# Patient Record
Sex: Male | Born: 1960 | Race: Black or African American | Hispanic: No | Marital: Single | Smoking: Never smoker
Health system: Southern US, Community
[De-identification: ages and names within clinical notes are randomized; demographics above are authoritative.]

## PROBLEM LIST (undated history)

## (undated) DIAGNOSIS — I10 Essential (primary) hypertension: Secondary | ICD-10-CM

## (undated) HISTORY — PX: WRIST SURGERY: SHX841

---

## 2007-05-26 ENCOUNTER — Emergency Department: Payer: Self-pay | Admitting: Emergency Medicine

## 2015-02-23 ENCOUNTER — Emergency Department: Admit: 2015-02-23 | Disposition: A | Discharge status: Home / Self Care | Payer: Self-pay | Admitting: Internal Medicine

## 2015-03-09 ENCOUNTER — Emergency Department
Admit: 2015-03-09 | Disposition: A | Discharge status: Home / Self Care | Payer: Self-pay | Admitting: Emergency Medicine

## 2015-11-11 ENCOUNTER — Emergency Department: Payer: Self-pay

## 2015-11-11 ENCOUNTER — Emergency Department
Admission: EM | Admit: 2015-11-11 | Discharge: 2015-11-11 | Disposition: A | Discharge status: Home / Self Care | Payer: Self-pay | Attending: Emergency Medicine | Admitting: Emergency Medicine

## 2015-11-11 ENCOUNTER — Encounter: Payer: Self-pay | Admitting: Emergency Medicine

## 2015-11-11 DIAGNOSIS — S99911A Unspecified injury of right ankle, initial encounter: Secondary | ICD-10-CM | POA: Insufficient documentation

## 2015-11-11 DIAGNOSIS — Y92410 Unspecified street and highway as the place of occurrence of the external cause: Secondary | ICD-10-CM | POA: Insufficient documentation

## 2015-11-11 DIAGNOSIS — Y9301 Activity, walking, marching and hiking: Secondary | ICD-10-CM | POA: Insufficient documentation

## 2015-11-11 DIAGNOSIS — S199XXA Unspecified injury of neck, initial encounter: Secondary | ICD-10-CM | POA: Insufficient documentation

## 2015-11-11 DIAGNOSIS — Y998 Other external cause status: Secondary | ICD-10-CM | POA: Insufficient documentation

## 2015-11-11 DIAGNOSIS — M791 Myalgia, unspecified site: Secondary | ICD-10-CM

## 2015-11-11 DIAGNOSIS — I10 Essential (primary) hypertension: Secondary | ICD-10-CM | POA: Insufficient documentation

## 2015-11-11 DIAGNOSIS — S3992XA Unspecified injury of lower back, initial encounter: Secondary | ICD-10-CM | POA: Insufficient documentation

## 2015-11-11 HISTORY — DX: Essential (primary) hypertension: I10

## 2015-11-11 MED ORDER — IBUPROFEN 800 MG PO TABS
800.0000 mg | ORAL_TABLET | Freq: Once | ORAL | Status: AC
  Administered 2015-11-11: 800 mg via ORAL
  Filled 2015-11-11: qty 1

## 2015-11-11 MED ORDER — IBUPROFEN 800 MG PO TABS: 800.0000 mg | ORAL_TABLET | Freq: Three times a day (TID) | ORAL | Status: DC | PRN

## 2015-11-11 MED ORDER — CYCLOBENZAPRINE HCL 10 MG PO TABS: 10.0000 mg | ORAL_TABLET | Freq: Three times a day (TID) | ORAL | Status: DC | PRN

## 2015-11-11 MED ORDER — CYCLOBENZAPRINE HCL 10 MG PO TABS
10.0000 mg | ORAL_TABLET | Freq: Once | ORAL | Status: AC
  Administered 2015-11-11: 10 mg via ORAL
  Filled 2015-11-11: qty 1

## 2015-11-11 NOTE — Discharge Instructions (Signed)
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
## 2015-11-11 NOTE — ED Notes (Signed)
Pt ambulatory at discharge.  
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
## 2015-11-11 NOTE — ED Notes (Signed)
Pt told to lay flat until xray is cleared. Pt attempts to get up and sit up while saying, "my back is sore".

## 2018-06-10 ENCOUNTER — Encounter: Payer: Self-pay | Admitting: Medical Oncology

## 2018-06-10 ENCOUNTER — Emergency Department
Admission: EM | Admit: 2018-06-10 | Discharge: 2018-06-10 | Disposition: A | Discharge status: Home / Self Care | Payer: Self-pay | Attending: Emergency Medicine | Admitting: Emergency Medicine

## 2018-06-10 ENCOUNTER — Emergency Department: Payer: Self-pay

## 2018-06-10 DIAGNOSIS — M7731 Calcaneal spur, right foot: Secondary | ICD-10-CM | POA: Insufficient documentation

## 2018-06-10 DIAGNOSIS — I10 Essential (primary) hypertension: Secondary | ICD-10-CM | POA: Insufficient documentation

## 2018-06-10 MED ORDER — NAPROXEN 500 MG PO TABS: 500.0000 mg | ORAL_TABLET | Freq: Two times a day (BID) | ORAL | Status: DC
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
## 2018-06-10 NOTE — ED Triage Notes (Signed)
Pt reports tripping Wednesday and since then has been having pain to rt heel. Pt ambulatory .

## 2018-06-10 NOTE — ED Notes (Signed)
See triage note stats he fell on weds  Having pain to heel   No swelling noted  Good pulses

## 2018-06-10 NOTE — Discharge Instructions (Addendum)
Follow discharge care instruction.  Advised to try over-the-counter heel spurs supports from the local pharmacy.

## 2019-05-07 ENCOUNTER — Emergency Department: Payer: Self-pay

## 2019-05-07 ENCOUNTER — Other Ambulatory Visit: Payer: Self-pay

## 2019-05-07 ENCOUNTER — Emergency Department
Admission: EM | Admit: 2019-05-07 | Discharge: 2019-05-07 | Disposition: A | Discharge status: Home / Self Care | Payer: Self-pay | Attending: Emergency Medicine | Admitting: Emergency Medicine

## 2019-05-07 ENCOUNTER — Encounter: Payer: Self-pay | Admitting: Emergency Medicine

## 2019-05-07 DIAGNOSIS — M519 Unspecified thoracic, thoracolumbar and lumbosacral intervertebral disc disorder: Secondary | ICD-10-CM

## 2019-05-07 DIAGNOSIS — M5136 Other intervertebral disc degeneration, lumbar region: Secondary | ICD-10-CM | POA: Insufficient documentation

## 2019-05-07 DIAGNOSIS — I10 Essential (primary) hypertension: Secondary | ICD-10-CM | POA: Insufficient documentation

## 2019-05-07 DIAGNOSIS — M549 Dorsalgia, unspecified: Secondary | ICD-10-CM | POA: Insufficient documentation

## 2019-05-07 DIAGNOSIS — W19XXXA Unspecified fall, initial encounter: Secondary | ICD-10-CM

## 2019-05-07 LAB — CBC WITH DIFFERENTIAL/PLATELET
Abs Immature Granulocytes: 0.02 10*3/uL (ref 0.00–0.07)
Basophils Absolute: 0.1 10*3/uL (ref 0.0–0.1)
Basophils Relative: 1 %
Eosinophils Absolute: 0.1 10*3/uL (ref 0.0–0.5)
Eosinophils Relative: 1 %
HCT: 48.5 % (ref 39.0–52.0)
Hemoglobin: 16.6 g/dL (ref 13.0–17.0)
Immature Granulocytes: 0 %
Lymphocytes Relative: 22 %
Lymphs Abs: 2.2 10*3/uL (ref 0.7–4.0)
MCH: 31.6 pg (ref 26.0–34.0)
MCHC: 34.2 g/dL (ref 30.0–36.0)
MCV: 92.4 fL (ref 80.0–100.0)
Monocytes Absolute: 1 10*3/uL (ref 0.1–1.0)
Monocytes Relative: 10 %
Neutro Abs: 6.6 10*3/uL (ref 1.7–7.7)
Neutrophils Relative %: 66 %
Platelets: 202 10*3/uL (ref 150–400)
RBC: 5.25 MIL/uL (ref 4.22–5.81)
RDW: 12.3 % (ref 11.5–15.5)
WBC: 10 10*3/uL (ref 4.0–10.5)
nRBC: 0 % (ref 0.0–0.2)

## 2019-05-07 LAB — COMPREHENSIVE METABOLIC PANEL
ALT: 80 U/L — ABNORMAL HIGH (ref 0–44)
AST: 83 U/L — ABNORMAL HIGH (ref 15–41)
Albumin: 3.9 g/dL (ref 3.5–5.0)
Alkaline Phosphatase: 53 U/L (ref 38–126)
Anion gap: 9 (ref 5–15)
BUN: 11 mg/dL (ref 6–20)
CO2: 21 mmol/L — ABNORMAL LOW (ref 22–32)
Calcium: 9.1 mg/dL (ref 8.9–10.3)
Chloride: 110 mmol/L (ref 98–111)
Creatinine, Ser: 1.09 mg/dL (ref 0.61–1.24)
GFR calc Af Amer: >60 mL/min (ref >60)
GFR calc non Af Amer: >60 mL/min (ref >60)
Glucose, Bld: 115 mg/dL — ABNORMAL HIGH (ref 70–99)
Potassium: 3.7 mmol/L (ref 3.5–5.1)
Sodium: 140 mmol/L (ref 135–145)
Total Bilirubin: 0.8 mg/dL (ref 0.3–1.2)
Total Protein: 7.7 g/dL (ref 6.5–8.1)

## 2019-05-07 LAB — TROPONIN I: Troponin I: <0.03 ng/mL (ref <0.03)

## 2019-05-07 LAB — ETHANOL: Alcohol, Ethyl (B): 22 mg/dL — ABNORMAL HIGH (ref <10)

## 2019-05-07 MED ORDER — PREDNISONE 10 MG (21) PO TBPK: ORAL_TABLET | ORAL | 0 refills | Status: AC

## 2019-05-07 MED ORDER — HYDROMORPHONE HCL 1 MG/ML IJ SOLN
1.0000 mg | Freq: Once | INTRAMUSCULAR | Status: AC
  Administered 2019-05-07: 1 mg via INTRAVENOUS
  Filled 2019-05-07: qty 1

## 2019-05-07 MED ORDER — OXYCODONE-ACETAMINOPHEN 5-325 MG PO TABS: 1.0000 | ORAL_TABLET | ORAL | 0 refills | Status: AC | PRN

## 2019-05-07 MED ORDER — MORPHINE SULFATE (PF) 4 MG/ML IV SOLN
6.0000 mg | Freq: Once | INTRAVENOUS | Status: AC
  Administered 2019-05-07: 6 mg via INTRAVENOUS
  Filled 2019-05-07: qty 2

## 2019-05-07 MED ORDER — SODIUM CHLORIDE 0.9 % IV BOLUS
1000.0000 mL | Freq: Once | INTRAVENOUS | Status: AC
  Administered 2019-05-07: 1000 mL via INTRAVENOUS

## 2019-05-07 MED ORDER — CYCLOBENZAPRINE HCL 10 MG PO TABS
5.0000 mg | ORAL_TABLET | Freq: Once | ORAL | Status: AC
  Administered 2019-05-07: 5 mg via ORAL
  Filled 2019-05-07: qty 1

## 2019-05-07 NOTE — ED Notes (Signed)
Pt able to get up and ambulated now. Is walking up and down the hallway at his own request (doesn't want to sit back down).

## 2019-05-07 NOTE — ED Notes (Signed)
Attempted to get pt up to ambulate - pt unable to pull himself up and states unable to walk. Pt requesting food, advised he can't eat until we figure out what's wrong. Pt states understanding.

## 2019-05-07 NOTE — ED Provider Notes (Signed)
[facility] Emergency Department Provider Note  ____________________________________________   First MD Initiated Contact with Patient 05/07/19 1412     (approximate)  I have reviewed the triage vital signs and the nursing notes.   HISTORY  Chief Complaint Fall (yesterday)    HPI Shawn Warner is a 58 y.o. male here with syncopal episode.  The patient states he was very intoxicated at the park yesterday.  He states that he believes he gets intoxicated he passed out.  He fell forward, striking his head.  He is unsure whether he lost consciousness prior to or after the fall.  He is unsure how long he was down.  He was able to get up and walk home, but states that since then, he has had progressive worsening aching, throbbing, midline thoracic back pain.  He said difficulty walking due to this pain.  Denies any distal numbness or weakness in lower or upper extremities.  He presents today due to persistent pain, worse with walking.  No alleviating factors.  No loss of bowel or bladder function.  No fevers or chills.  He states he often passes out when he drinks this much.  Denies any chest pain or shortness of breath.        Past Medical History:  Diagnosis Date  . Hypertension     There are no active problems to display for this patient.   Past Surgical History:  Procedure Laterality Date  . WRIST SURGERY      Prior to Admission medications   Not on File    Allergies Patient has no known allergies.  History reviewed. No pertinent family history.  Social History Social History   Tobacco Use  . Smoking status: Never Smoker  . Smokeless tobacco: Never Used  Substance Use Topics  . Alcohol use: Yes    Comment: weekends  . Drug use: No    Review of Systems  Review of Systems  Constitutional: Negative for chills, fatigue and fever.  HENT: Negative for congestion and rhinorrhea.   Eyes: Negative for visual disturbance.  Respiratory:  Negative for cough and shortness of breath.   Gastrointestinal: Negative for abdominal pain, diarrhea, nausea and vomiting.  Genitourinary: Negative for dysuria and flank pain.  Musculoskeletal: Positive for back pain and gait problem.  Skin: Negative for rash and wound.  Neurological: Positive for syncope. Negative for weakness and light-headedness.  All other systems reviewed and are negative.    ____________________________________________  PHYSICAL EXAM:      VITAL SIGNS: ED Triage Vitals  Enc Vitals Group     BP 05/07/19 1341 (!) 185/106     Pulse Rate 05/07/19 1341 94     Resp 05/07/19 1341 20     Temp 05/07/19 1341 98 F (36.7 C)     Temp Source 05/07/19 1341 Oral     SpO2 05/07/19 1341 98 %     Weight 05/07/19 1342 259 lb (117.5 kg)     Height 05/07/19 1342 5\' 11"  (1.803 m)     Head Circumference --      Peak Flow --      Pain Score 05/07/19 1341 9     Pain Loc --      Pain Edu? --      Excl. in GC? --      Physical Exam Vitals signs and nursing note reviewed.  Constitutional:      General: He is not in acute distress.    Appearance: He is  well-developed.  HENT:     Head: Normocephalic and atraumatic.  Eyes:     Conjunctiva/sclera: Conjunctivae normal.  Neck:     Musculoskeletal: Neck supple.  Cardiovascular:     Rate and Rhythm: Normal rate and regular rhythm.     Heart sounds: Normal heart sounds. No murmur. No friction rub.  Pulmonary:     Effort: Pulmonary effort is normal. No respiratory distress.     Breath sounds: Normal breath sounds. No wheezing or rales.  Abdominal:     General: There is no distension.     Palpations: Abdomen is soft.     Tenderness: There is no abdominal tenderness.  Musculoskeletal:     Comments: Significant midline tenderness across the lower thoracic and upper lumbar spine.  No deformity.  Skin:    General: Skin is warm.     Capillary Refill: Capillary refill takes less than 2 seconds.  Neurological:     Mental  Status: He is alert and oriented to person, place, and time.     Motor: No abnormal muscle tone.     Comments: Strength 5 and 5 bilateral upper and lower extremities.  Normal sensation light touch.  Reflexes 2+ and symmetric bilateral quadriceps.       ____________________________________________   LABS (all labs ordered are listed, but only abnormal results are displayed)  Labs Reviewed  COMPREHENSIVE METABOLIC PANEL - Abnormal; Notable for the following components:      Result Value   CO2 21 (*)    Glucose, Bld 115 (*)    AST 83 (*)    ALT 80 (*)    All other components within normal limits  ETHANOL - Abnormal; Notable for the following components:   Alcohol, Ethyl (B) 22 (*)    All other components within normal limits  CBC WITH DIFFERENTIAL/PLATELET  TROPONIN I    ____________________________________________  EKG:   EKG Interpretation  Date/Time:  Sunday May 07 2019 13:43:28 EDT Ventricular Rate:  94 PR Interval:    QRS Duration: 96 QT Interval:  368 QTC Calculation: 461 R Axis:   -19 Text Interpretation:  Sinus rhythm Borderline left axis deviation No ischemic changes Confirmed by Shaune PollackIsaacs, Errin Chewning [phone]([id number]) on 05/07/2019 4:39:52 PM       ________________________________________  RADIOLOGY All imaging, including plain films, CT scans, and ultrasounds, independently reviewed by me, and interpretations confirmed via formal radiology reads.  ED MD interpretation:   CT: Pending CXR: Pending  Official radiology report(s): Ct Head Wo Contrast  Result Date: 05/07/2019 CLINICAL DATA:  Per RN note in pt chart "Per pt he was sitting at a picnic table yesterday drinking. Went to stand up and fell. States loc. Doesn't remember exactly how he fell. Small abrasion to the left temple. His family helped him back into bed. Patient awoke today with low back pain. EXAM: CT HEAD WITHOUT CONTRAST CT CERVICAL SPINE WITHOUT CONTRAST TECHNIQUE: Multidetector CT imaging of the head  and cervical spine was performed following the standard protocol without intravenous contrast. Multiplanar CT image reconstructions of the cervical spine were also generated. COMPARISON:  None. FINDINGS: CT HEAD FINDINGS Brain: No evidence of acute infarction, hemorrhage, hydrocephalus, extra-axial collection or mass lesion/mass effect. Vascular: No hyperdense vessel or unexpected calcification. Skull: Normal. Negative for fracture or focal lesion. 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 Normal right globe and orbit. Mild ethmoid sinus mucosal thickening. Other: None. CT CERVICAL SPINE FINDINGS Alignment: Normal. Skull base and vertebrae: No acute fracture. No primary bone lesion or focal  pathologic process. Soft tissues and spinal canal: No prevertebral fluid or swelling. No visible canal hematoma. Disc levels: Moderate loss of disc height at C5-C6 with mild loss of disc height at C6-C7. Endplate spurring and irregularity is noted at these levels. There is also mild spondylotic disc bulging at these levels. No evidence of a disc herniation. Upper chest: No mass or adenopathy. No acute findings. Clear lung apices. Other: None. IMPRESSION: HEAD CT 1. No intracranial abnormality. 2. No skull fracture. CERVICAL CT 1. No fracture or acute finding. Electronically Signed   By: Amie Portlandavid  Ormond M.D.   On: 05/07/2019 15:29   Ct Cervical Spine Wo Contrast  Result Date: 05/07/2019 CLINICAL DATA:  Per RN note in pt chart "Per pt he was sitting at a picnic table yesterday drinking. Went to stand up and fell. States loc. Doesn't remember exactly how he fell. Small abrasion to the left temple. His family helped him back into bed. Patient awoke today with low back pain. EXAM: CT HEAD WITHOUT CONTRAST CT CERVICAL SPINE WITHOUT CONTRAST TECHNIQUE: Multidetector CT imaging of the head and cervical spine was performed following the standard protocol without intravenous contrast. Multiplanar CT image reconstructions of the  cervical spine were also generated. COMPARISON:  None. FINDINGS: CT HEAD FINDINGS Brain: No evidence of acute infarction, hemorrhage, hydrocephalus, extra-axial collection or mass lesion/mass effect. Vascular: No hyperdense vessel or unexpected calcification. Skull: Normal. Negative for fracture or focal lesion. 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 Normal right globe and orbit. Mild ethmoid sinus mucosal thickening. Other: None. CT CERVICAL SPINE FINDINGS Alignment: Normal. Skull base and vertebrae: No acute fracture. No primary bone lesion or focal pathologic process. Soft tissues and spinal canal: No prevertebral fluid or swelling. No visible canal hematoma. Disc levels: Moderate loss of disc height at C5-C6 with mild loss of disc height at C6-C7. Endplate spurring and irregularity is noted at these levels. There is also mild spondylotic disc bulging at these levels. No evidence of a disc herniation. Upper chest: No mass or adenopathy. No acute findings. Clear lung apices. Other: None. IMPRESSION: HEAD CT 1. No intracranial abnormality. 2. No skull fracture. CERVICAL CT 1. No fracture or acute finding. Electronically Signed   By: Amie Portlandavid  Ormond M.D.   On: 05/07/2019 15:29   Ct Thoracic Spine Wo Contrast  Result Date: 05/07/2019 CLINICAL DATA:  Fall.  Low back pain. EXAM: CT THORACIC SPINE WITHOUT CONTRAST TECHNIQUE: Multidetector CT images of the thoracic were obtained using the standard protocol without intravenous contrast. COMPARISON:  None. FINDINGS: Alignment: Normal. Vertebrae: No acute fracture or suspicious osseous lesion. 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 Multiple small Schmorl's nodes. Paraspinal and other soft tissues: Unremarkable. Disc levels: Neural foraminal stenosis at T3-4 and T4-5 due to facet arthrosis, moderate on the left at T4-5. No evidence of significant spinal canal stenosis. IMPRESSION: No acute osseous abnormality identified in  the thoracic spine. Electronically Signed   By: Sebastian AcheAllen  Grady M.D.   On: 05/07/2019 15:22   Ct Lumbar Spine Wo Contrast  Result Date: 05/07/2019 CLINICAL DATA:  Fall.  Low back pain. EXAM: CT LUMBAR SPINE WITHOUT CONTRAST TECHNIQUE: Multidetector CT imaging of the lumbar spine was performed without intravenous contrast administration. Multiplanar CT image reconstructions were also generated. COMPARISON:  Lumbar spine radiographs 11/11/2015 FINDINGS: Segmentation: 5 lumbar type vertebrae. Alignment: Normal. Vertebrae: No acute fracture or suspicious osseous lesion. Bridging osteophytes across the anterior and superior aspects of the left greater than right SI joints. Paraspinal  and other soft tissues: Unremarkable. Disc levels: L1-2: Mild left facet spurring without evidence of significant stenosis. L2-3: Mild disc bulging and facet hypertrophy result in borderline to mild spinal and bilateral neural foraminal stenosis. L3-4: Mild disc bulging and mild facet hypertrophy result in borderline to mild spinal and neural foraminal stenosis. L4-5: Circumferential disc bulging and a right subarticular disc protrusion result in right greater than left lateral recess stenosis with potential right L5 nerve root impingement. Disc bulging contributes to moderate right and mild left neural foraminal stenosis. There is borderline to mild spinal stenosis with prominent dorsal epidural fat contributing. L5-S1: Mild left facet arthrosis without stenosis. IMPRESSION: 1. No acute osseous abnormality identified in the lumbar spine. 2. Lumbar spondylosis most notable at L4-5 where a disc protrusion results in right lateral recess stenosis and potential L5 nerve root impingement. Electronically Signed   By: Logan Bores M.D.   On: 05/07/2019 15:29   Dg Chest Portable 1 View  Result Date: 05/07/2019 CLINICAL DATA:  Fall. EXAM: PORTABLE CHEST 1 VIEW COMPARISON:  None. FINDINGS: The heart, hila, and mediastinum are unremarkable. No  pneumothorax. No nodules, masses, or focal infiltrates. Chronic changes in the distal left clavicle. IMPRESSION: No acute abnormalities. Electronically Signed   By: Dorise Bullion III M.D   On: 05/07/2019 15:58    ____________________________________________  PROCEDURES   Procedure(s) performed (including Critical Care):  Procedures  ____________________________________________  INITIAL IMPRESSION / MDM / [place] / ED COURSE  As part of my medical decision making, I reviewed the following data within the electronic MEDICAL RECORD NUMBER Notes from prior ED visits and [place] Controlled Substance Database      *Shawn Warner was evaluated in Emergency Department on 05/07/2019 for the symptoms described in the history of present illness. He was evaluated in the context of the global COVID-19 pandemic, which necessitated consideration that the patient might be at risk for infection with the SARS-CoV-2 virus that causes COVID-19. Institutional protocols and algorithms that pertain to the evaluation of patients at risk for COVID-19 are in a state of rapid change based on information released by regulatory bodies including the CDC and federal and state organizations. These policies and algorithms were followed during the patient's care in the ED.  Some ED evaluations and interventions may be delayed as a result of limited staffing during the pandemic.*      Medical Decision Making: 58 yo M here with back pain after fall yesterday. Questionable syncope in setting of EtOH abuse. EKG here is non ischemic w/ normal intervals. Suspect syncope was 2/2 his alcohol intake. No known h/o arrhythmia, CHF. His labs are reassuring. No significant anemia. Neg troponin, doubt ischemic etiology. No focal numbness, weakness or sx to suggest CVA. CMP with mild transaminitis likely 2/2 etoh abuse.   Plan: F/u CT imaging, CXR. If no acute fx, tx supportively for possible DDD/lumbar disc injury. No signs of  cauda equina.  ____________________________________________  FINAL CLINICAL IMPRESSION(S) / ED DIAGNOSES  Final diagnoses:  None     MEDICATIONS GIVEN DURING THIS VISIT:  Medications  morphine 4 MG/ML injection 6 mg (6 mg Intravenous Given 05/07/19 1533)  sodium chloride 0.9 % bolus 1,000 mL (1,000 mLs Intravenous New Bag/Given 05/07/19 1533)     ED Discharge Orders    None       Note:  This document was prepared using Dragon voice recognition software and may include unintentional dictation errors.   Duffy Bruce, MD 05/07/19 [phone]

## 2019-05-07 NOTE — Discharge Instructions (Addendum)
Please seek medical attention for any high fevers, chest pain, shortness of breath, change in behavior, persistent vomiting, bloody stool or any other new or concerning symptoms.  

## 2019-05-07 NOTE — ED Triage Notes (Signed)
Per pt he was sitting at a picnic table yesterday drinking. Went to stand up and fell. States loc. Doesn't remember exactly how he fell. Small abrasion to the left temple. His family helped him back home and into bed. Pt in the same clothes as yesterday. States awoke today with low back pain

## 2019-11-23 IMAGING — DX PORTABLE CHEST - 1 VIEW
1 series · 1 of 1 positions shown · non-contrast
Comparison: None.

CLINICAL DATA: Fall.

EXAM:
PORTABLE CHEST 1 VIEW

[chest ap]
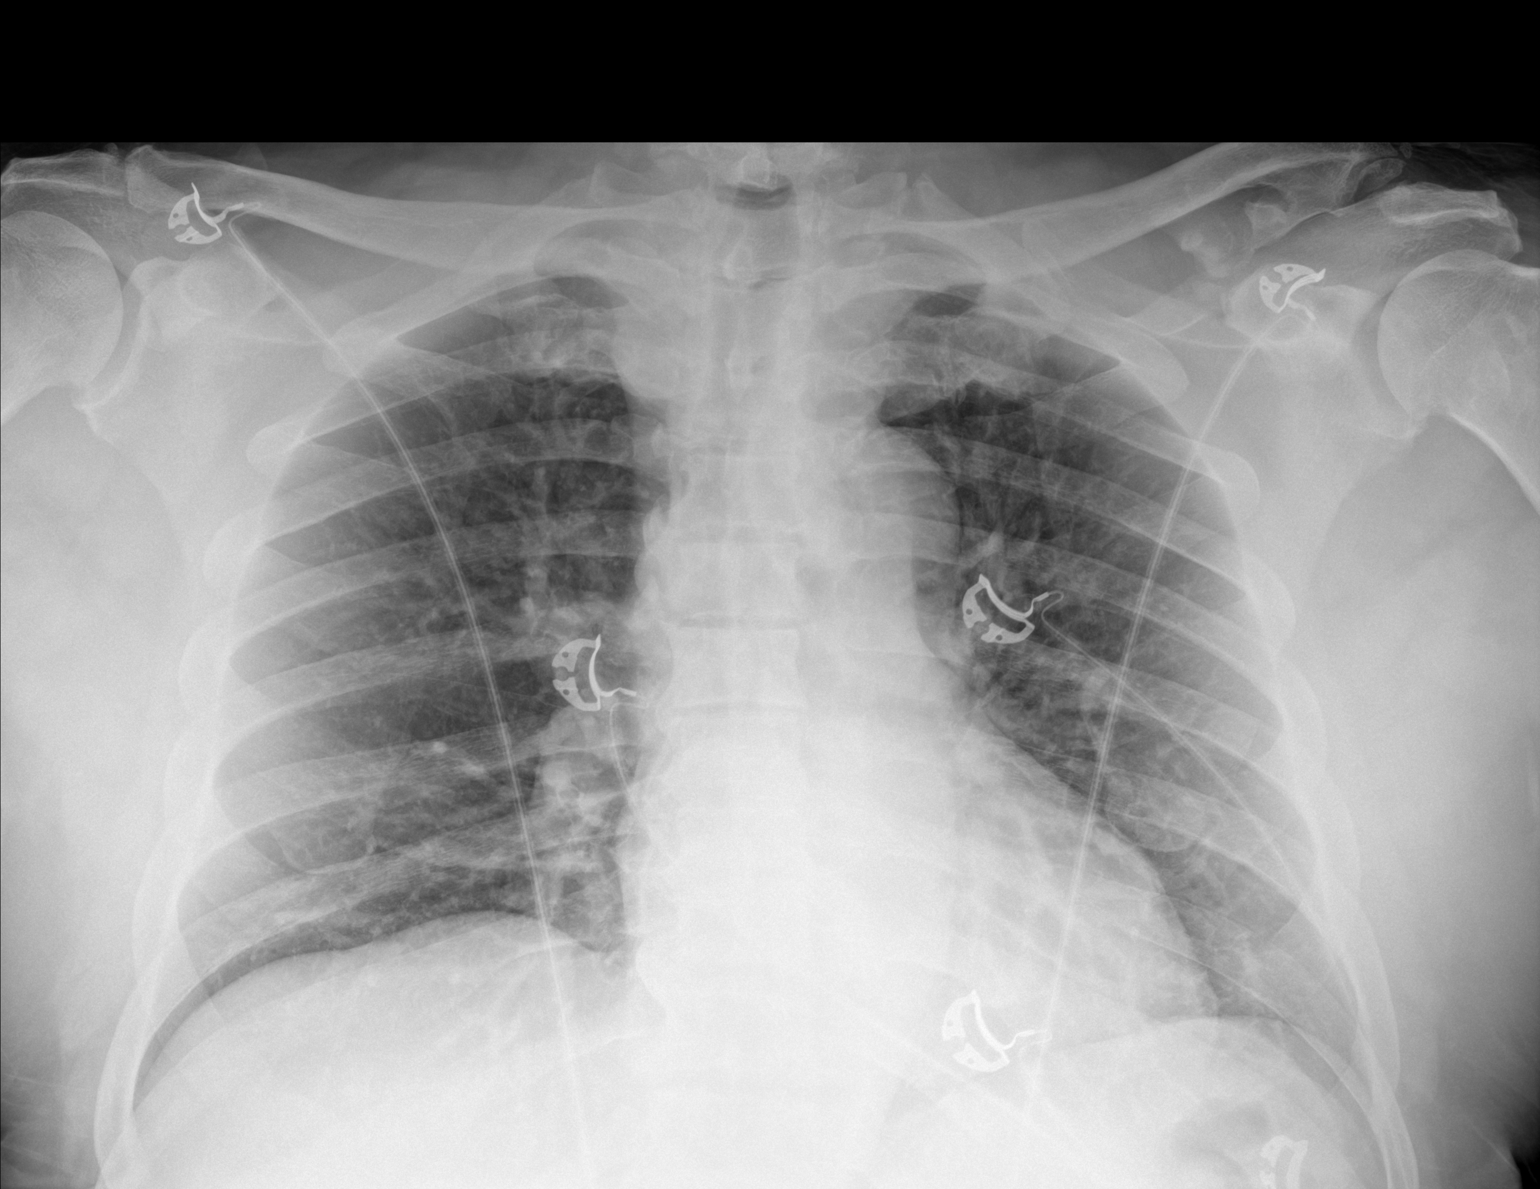

[1 of 1 positions shown; findings below may reference images not displayed]

FINDINGS: The heart, hila, and mediastinum are unremarkable. No pneumothorax.
No nodules, masses, or focal infiltrates. Chronic changes in the
distal left clavicle.
IMPRESSION: No acute abnormalities.

## 2019-11-23 IMAGING — CT CT LUMBAR SPINE WITHOUT CONTRAST
3 of 4 series · 13 of 33 positions shown, 16 images · non-contrast
Comparison: Lumbar spine radiographs 11/11/2015

CLINICAL DATA: Fall.  Low back pain.

EXAM:
CT LUMBAR SPINE WITHOUT CONTRAST
TECHNIQUE: Multidetector CT imaging of the lumbar spine was performed without
intravenous contrast administration. Multiplanar CT image
reconstructions were also generated.

[Series 6: sagittal bone · sagittal · 0.35mm/px · 5 of 110 slices shown, 6 images]
[im 37/110  bone]
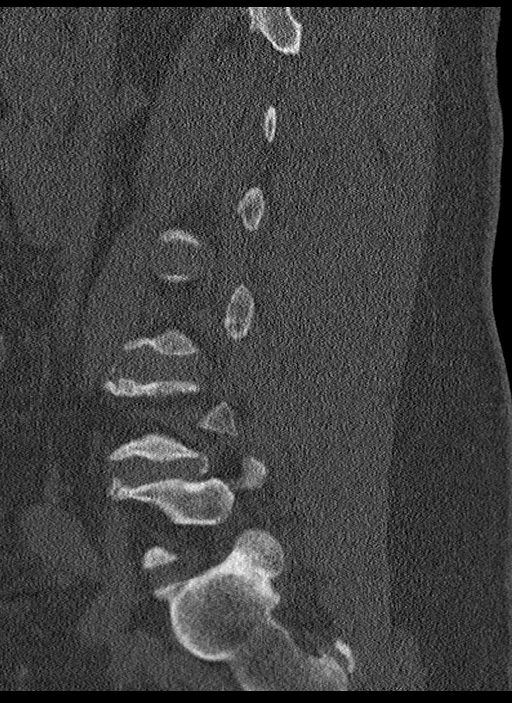
[im 46/110  bone]
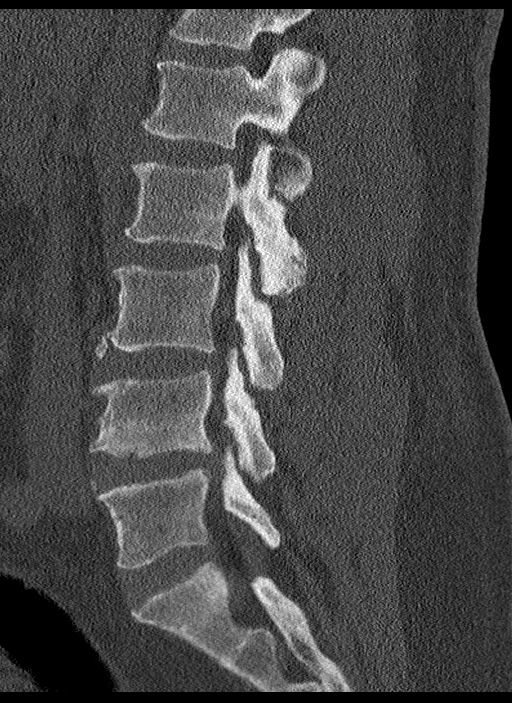
[im 55/110  soft-tissue]
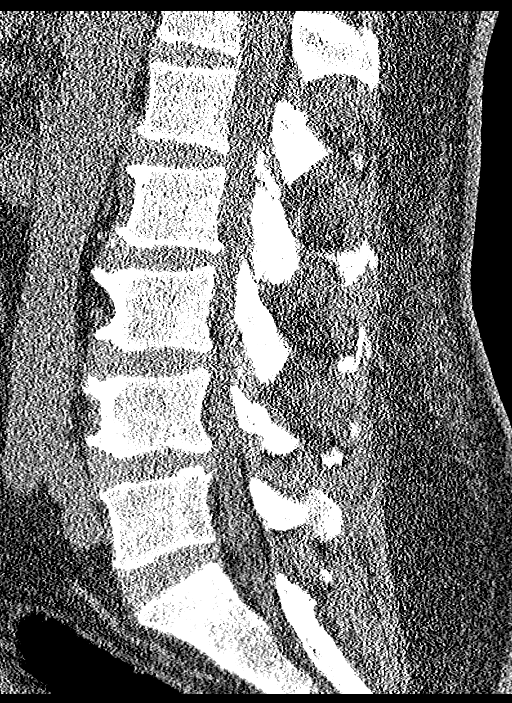
[im 55/110  bone]
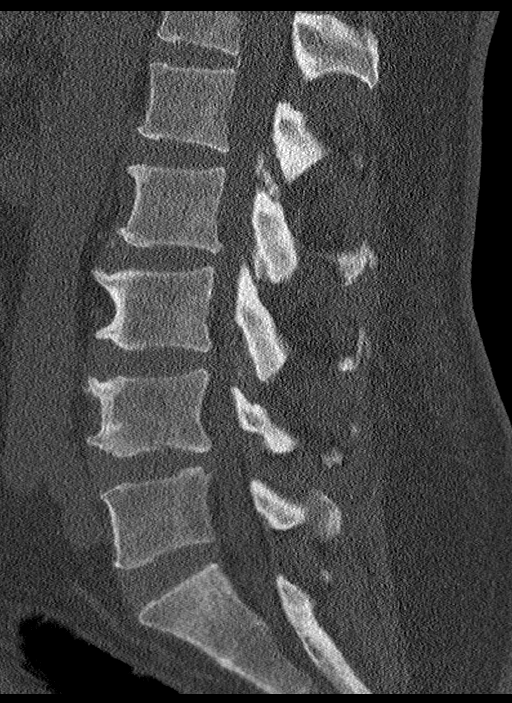
[im 64/110  bone]
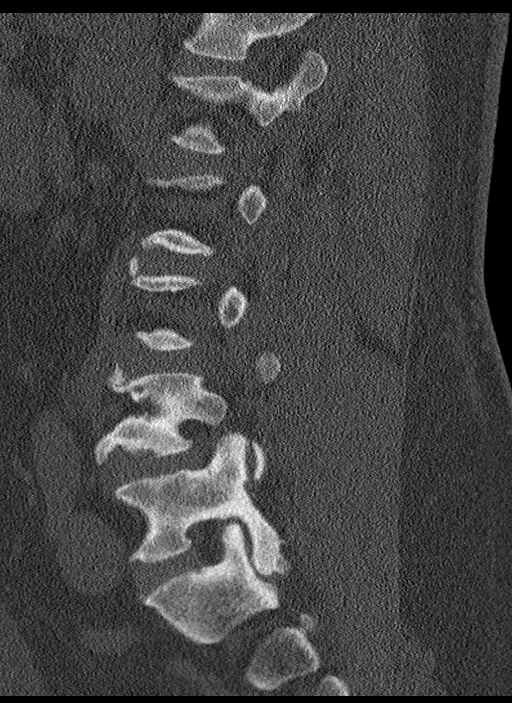
[im 73/110  bone]
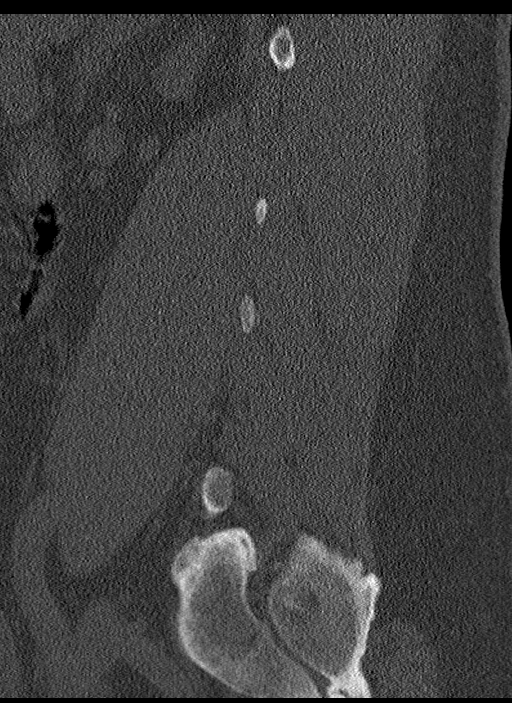

[Series 7: coronal bone · coronal · 0.43mm/px · 3 of 91 slices shown]
[im 19/91  bone]
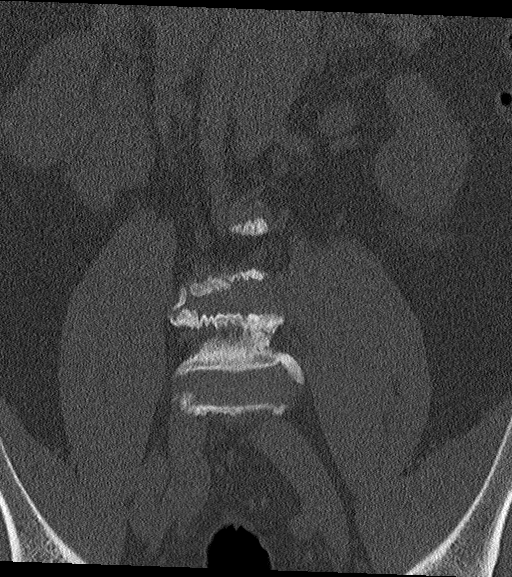
[im 37/91  bone]
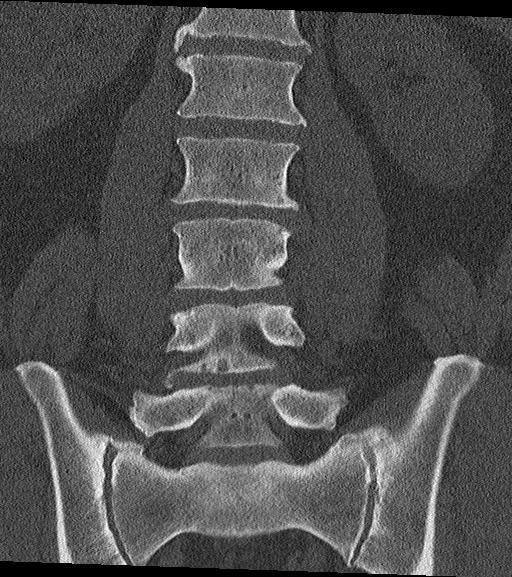
[im 55/91  bone]
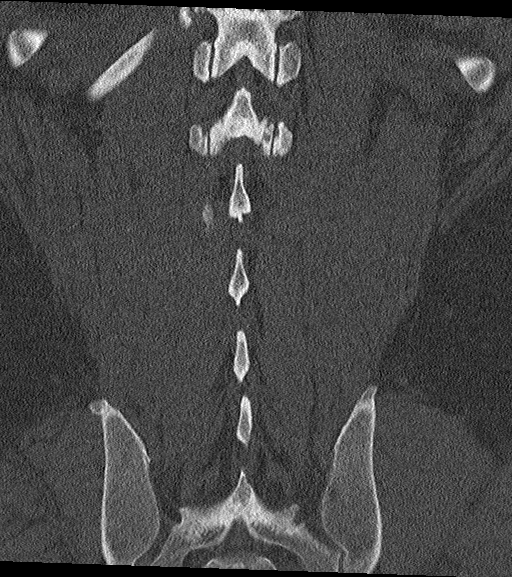

[Series 10: multi disc · axial · 0.28mm/px · z∈[-765,-575]mm · 5 of 119 slices shown, 7 images]
[im 20/119  soft-tissue]
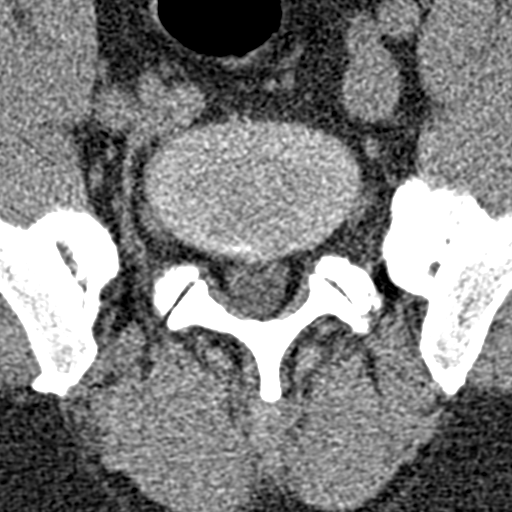
[im 20/119  bone]
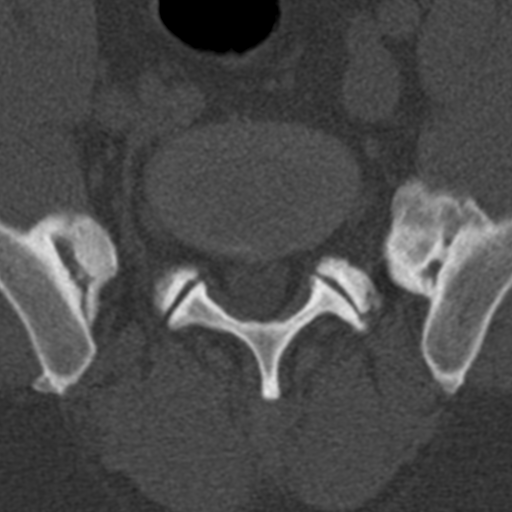
[im 40/119  bone]
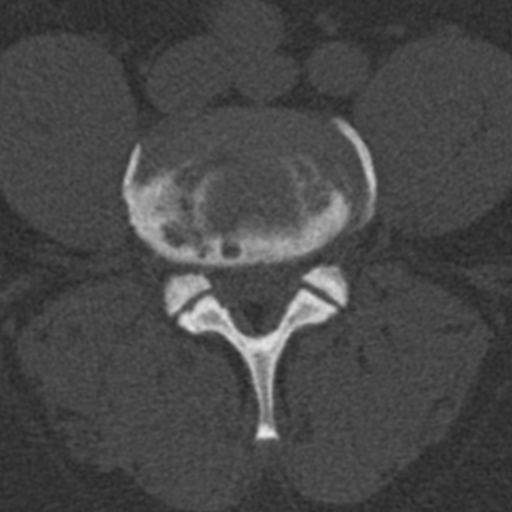
[im 60/119  bone]
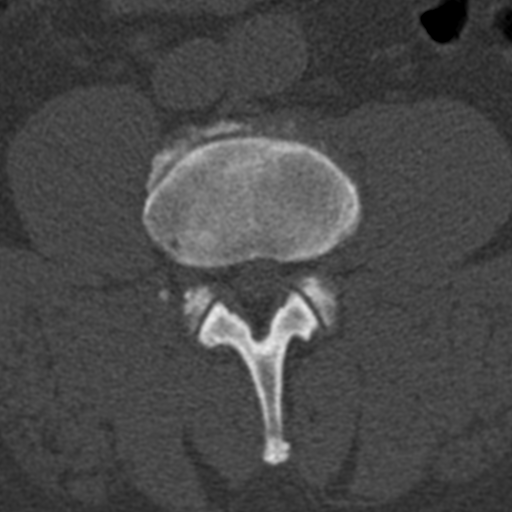
[im 79/119  bone]
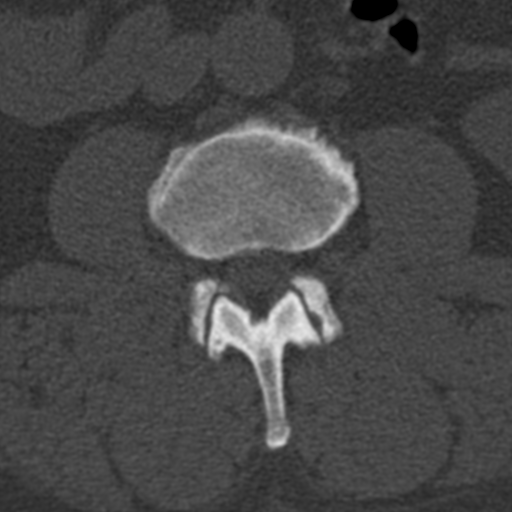
[im 99/119  soft-tissue]
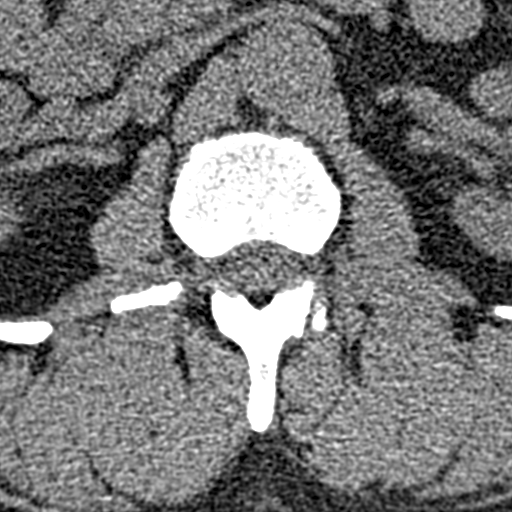
[im 99/119  bone]
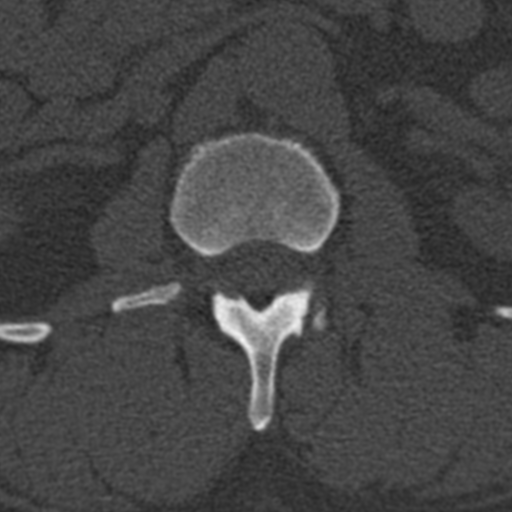

[13 of 33 positions shown; findings below may reference images not displayed]

FINDINGS: Segmentation: 5 lumbar type vertebrae.

Alignment: Normal.

Vertebrae: No acute fracture or suspicious osseous lesion. Bridging
osteophytes across the anterior and superior aspects of the left
greater than right SI joints.

Paraspinal and other soft tissues: Unremarkable.

Disc levels:

L1-2: Mild left facet spurring without evidence of significant
stenosis.

L2-3: Mild disc bulging and facet hypertrophy result in borderline
to mild spinal and bilateral neural foraminal stenosis.

L3-4: Mild disc bulging and mild facet hypertrophy result in
borderline to mild spinal and neural foraminal stenosis.

L4-5: Circumferential disc bulging and a right subarticular disc
protrusion result in right greater than left lateral recess stenosis
with potential right L5 nerve root impingement. Disc bulging
contributes to moderate right and mild left neural foraminal
stenosis. There is borderline to mild spinal stenosis with prominent
dorsal epidural fat contributing.

L5-S1: Mild left facet arthrosis without stenosis.
IMPRESSION: 1. No acute osseous abnormality identified in the lumbar spine.
2. Lumbar spondylosis most notable at L4-5 where a disc protrusion
results in right lateral recess stenosis and potential L5 nerve root
impingement.

## 2024-01-18 ENCOUNTER — Encounter: Payer: Self-pay | Admitting: Internal Medicine

## 2024-01-19 ENCOUNTER — Encounter: Payer: Self-pay | Admitting: Internal Medicine

## 2024-01-19 ENCOUNTER — Ambulatory Visit: Payer: 59 | Admitting: Certified Registered Nurse Anesthetist

## 2024-01-19 ENCOUNTER — Ambulatory Visit
Admission: RE | Admit: 2024-01-19 | Discharge: 2024-01-19 | Disposition: A | Discharge status: Home / Self Care | Payer: 59 | Attending: Internal Medicine | Admitting: Internal Medicine

## 2024-01-19 ENCOUNTER — Encounter
Admission: RE | Disposition: A | Discharge status: Home / Self Care | Payer: Self-pay | Source: Home / Self Care | Attending: Internal Medicine

## 2024-01-19 DIAGNOSIS — Z800 Family history of malignant neoplasm of digestive organs: Secondary | ICD-10-CM | POA: Insufficient documentation

## 2024-01-19 DIAGNOSIS — K5730 Diverticulosis of large intestine without perforation or abscess without bleeding: Secondary | ICD-10-CM | POA: Insufficient documentation

## 2024-01-19 DIAGNOSIS — Z1211 Encounter for screening for malignant neoplasm of colon: Secondary | ICD-10-CM | POA: Insufficient documentation

## 2024-01-19 DIAGNOSIS — K640 First degree hemorrhoids: Secondary | ICD-10-CM | POA: Diagnosis not present

## 2024-01-19 DIAGNOSIS — I10 Essential (primary) hypertension: Secondary | ICD-10-CM | POA: Diagnosis not present

## 2024-01-19 HISTORY — PX: COLONOSCOPY WITH PROPOFOL: SHX5780

## 2024-01-19 SURGERY — COLONOSCOPY WITH PROPOFOL
Anesthesia: General

## 2024-01-19 MED ORDER — LIDOCAINE HCL (CARDIAC) PF 100 MG/5ML IV SOSY
PREFILLED_SYRINGE | INTRAVENOUS | Status: DC | PRN
  Administered 2024-01-19: 60 mg via INTRAVENOUS

## 2024-01-19 MED ORDER — SODIUM CHLORIDE 0.9 % IV SOLN: INTRAVENOUS | Status: DC

## 2024-01-19 MED ORDER — LIDOCAINE HCL (PF) 2 % IJ SOLN
INTRAMUSCULAR | Status: AC
  Filled 2024-01-19: qty 5

## 2024-01-19 MED ORDER — PROPOFOL 500 MG/50ML IV EMUL
INTRAVENOUS | Status: DC | PRN
  Administered 2024-01-19: 125 ug/kg/min via INTRAVENOUS

## 2024-01-19 MED ORDER — PROPOFOL 10 MG/ML IV BOLUS
INTRAVENOUS | Status: DC | PRN
  Administered 2024-01-19: 70 mg via INTRAVENOUS

## 2024-01-19 MED ORDER — PROPOFOL 1000 MG/100ML IV EMUL
INTRAVENOUS | Status: AC
  Filled 2024-01-19: qty 100

## 2024-01-19 NOTE — Op Note (Signed)
 [facility] Gastroenterology Patient Name: Shawn Warner Procedure Date: 01/19/2024 9:11 AM MRN: [id number] Account #: [id number] Date of Birth: [birth year] Admit Type: Outpatient Age: 63 Room: [facility] ENDO ROOM 2 Gender: Male Note Status: Finalized Instrument Name: Nelda Marseille [id number] Procedure:             Colonoscopy Indications:           Colon cancer screening in patient at increased risk:                         Colorectal cancer in sister Providers:             Boykin Nearing. Norma Fredrickson MD, MD Referring MD:          No Local Md, MD (Referring MD) Medicines:             Propofol per Anesthesia Complications:         No immediate complications. Estimated blood loss: None. Procedure:             Pre-Anesthesia Assessment:                        - The risks and benefits of the procedure and the                         sedation options and risks were discussed with the                         patient. All questions were answered and informed                         consent was obtained.                        - Patient identification and proposed procedure were                         verified prior to the procedure by the nurse. The                         procedure was verified in the procedure room.                        - ASA Grade Assessment: III - A patient with severe                         systemic disease.                        - After reviewing the risks and benefits, the patient                         was deemed in satisfactory condition to undergo the                         procedure.                        After obtaining informed consent, the colonoscope was  passed under direct vision. Throughout the procedure,                         the patient's blood pressure, pulse, and oxygen                         saturations were monitored continuously. The                         Colonoscope was introduced through the anus and                          advanced to the the cecum, identified by appendiceal                         orifice and ileocecal valve. The colonoscopy was                         performed without difficulty. The patient tolerated                         the procedure well. The quality of the bowel                         preparation was good except the ascending colon was                         fair. The ileocecal valve, appendiceal orifice, and                         rectum were photographed. Findings:      The perianal and digital rectal examinations were normal. Pertinent       negatives include normal sphincter tone and no palpable rectal lesions.      Non-bleeding internal hemorrhoids were found during retroflexion. The       hemorrhoids were Grade I (internal hemorrhoids that do not prolapse).      Many small-mouthed diverticula were found in the sigmoid colon.      The exam was otherwise without abnormality. Impression:            - Non-bleeding internal hemorrhoids.                        - Diverticulosis in the sigmoid colon.                        - The examination was otherwise normal.                        - No specimens collected. Recommendation:        - Patient has a contact number available for                         emergencies. The signs and symptoms of potential                         delayed complications were discussed with the patient.  Return to normal activities tomorrow. Written                         discharge instructions were provided to the patient.                        - Resume previous diet.                        - Continue present medications.                        - Repeat colonoscopy in 5 years for screening purposes.                        - Return to GI office PRN.                        - The findings and recommendations were discussed with                         the patient. Procedure Code(s):     --- Professional ---                         G9562, Colorectal cancer screening; colonoscopy on                         individual at high risk Diagnosis Code(s):     --- Professional ---                        K57.30, Diverticulosis of large intestine without                         perforation or abscess without bleeding                        K64.0, First degree hemorrhoids                        Z80.0, Family history of malignant neoplasm of                         digestive organs CPT copyright 2022 American Medical Association. All rights reserved. The codes documented in this report are preliminary and upon coder review may  be revised to meet current compliance requirements. Stanton Kidney MD, MD 01/19/2024 9:42:10 AM This report has been signed electronically. Number of Addenda: 0 Note Initiated On: 01/19/2024 9:11 AM Scope Withdrawal Time: 0 hours 6 minutes 8 seconds  Total Procedure Duration: 0 hours 9 minutes 59 seconds  Estimated Blood Loss:  Estimated blood loss: none.      [facility]

## 2024-01-19 NOTE — Anesthesia Postprocedure Evaluation (Signed)
 Anesthesia Post Note  Patient: Shawn Warner  Procedure(s) Performed: COLONOSCOPY WITH PROPOFOL  Patient location during evaluation: Endoscopy Anesthesia Type: General Level of consciousness: awake and alert Pain management: pain level controlled Vital Signs Assessment: post-procedure vital signs reviewed and stable Respiratory status: spontaneous breathing, nonlabored ventilation, respiratory function stable and patient connected to nasal cannula oxygen Cardiovascular status: blood pressure returned to baseline and stable Postop Assessment: no apparent nausea or vomiting Anesthetic complications: no   No notable events documented.   Last Vitals:  Vitals:   01/19/24 0944 01/19/24 0954  BP: (!) 130/104 (!) 125/104  Pulse: 83 77  Resp: 17 20  Temp:    SpO2: 95% 95%    Last Pain:  Vitals:   01/19/24 0954  TempSrc:   PainSc: 0-No pain                 Cleda Mccreedy Desira Alessandrini

## 2024-01-19 NOTE — Interval H&P Note (Signed)
 History and Physical Interval Note:  01/19/2024 9:21 AM  Shawn Warner  has presented today for surgery, with the diagnosis of FH Colon Cancer Rectal Pain.  The various methods of treatment have been discussed with the patient and family. After consideration of risks, benefits and other options for treatment, the patient has consented to  Procedure(s): COLONOSCOPY WITH PROPOFOL (N/A) as a surgical intervention.  The patient's history has been reviewed, patient examined, no change in status, stable for surgery.  I have reviewed the patient's chart and labs.  Questions were answered to the patient's satisfaction.     [place], [place]

## 2024-01-19 NOTE — Anesthesia Preprocedure Evaluation (Signed)
 Anesthesia Evaluation  Patient identified by MRN, date of birth, ID band Patient awake    Reviewed: Allergy & Precautions, NPO status , Patient's Chart, lab work & pertinent test results  History of Anesthesia Complications Negative for: history of anesthetic complications  Airway Mallampati: III  TM Distance: <3 FB Neck ROM: full    Dental  (+) Chipped, Poor Dentition, Missing   Pulmonary neg pulmonary ROS, neg shortness of breath   Pulmonary exam normal        Cardiovascular hypertension, (-) angina Normal cardiovascular exam     Neuro/Psych negative neurological ROS  negative psych ROS   GI/Hepatic negative GI ROS, Neg liver ROS,,,  Endo/Other  negative endocrine ROS    Renal/GU negative Renal ROS  negative genitourinary   Musculoskeletal   Abdominal   Peds  Hematology negative hematology ROS (+)   Anesthesia Other Findings Left eye closed at baseline PreOp  Past Medical History: No date: Hypertension  Past Surgical History: No date: WRIST SURGERY  BMI    Body Mass Index: 37.38 kg/m      Reproductive/Obstetrics negative OB ROS                             Anesthesia Physical Anesthesia Plan  ASA: 2  Anesthesia Plan: General   Post-op Pain Management:    Induction: Intravenous  PONV Risk Score and Plan: Propofol infusion and TIVA  Airway Management Planned: Natural Airway and Nasal Cannula  Additional Equipment:   Intra-op Plan:   Post-operative Plan:   Informed Consent: I have reviewed the patients History and Physical, chart, labs and discussed the procedure including the risks, benefits and alternatives for the proposed anesthesia with the patient or authorized representative who has indicated his/her understanding and acceptance.     Dental Advisory Given  Plan Discussed with: Anesthesiologist, CRNA and Surgeon  Anesthesia Plan Comments: (Patient  consented for risks of anesthesia including but not limited to:  - adverse reactions to medications - risk of airway placement if required - damage to eyes, teeth, lips or other oral mucosa - nerve damage due to positioning  - sore throat or hoarseness - Damage to heart, brain, nerves, lungs, other parts of body or loss of life  Patient voiced understanding and assent.)       Anesthesia Quick Evaluation

## 2024-01-19 NOTE — H&P (Signed)
 Outpatient short stay form Pre-procedure 01/19/2024 9:20 AM Shawn Warner K. Norma Fredrickson, M.D.  Primary Physician: N/A  Reason for visit:  Family history of colon cancer (sister)  History of present illness:   63 year old patient presenting for family history of colon cancer. Patient denies any change in bowel habits, rectal bleeding or involuntary weight loss.     Current Facility-Administered Medications:    0.9 %  sodium chloride infusion, , Intravenous, Continuous, Joab Carden, Boykin Nearing, MD  Medications Prior to Admission  Medication Sig Dispense Refill Last Dose/Taking   predniSONE (STERAPRED UNI-PAK 21 TAB) 10 MG (21) TBPK tablet Per packaging instructions 21 tablet 0      No Known Allergies   Past Medical History:  Diagnosis Date   Hypertension     Review of systems:  Otherwise negative.    Physical Exam  Gen: Alert, oriented. Appears stated age.  HEENT: [place]/AT. PERRLA. Lungs: CTA, no wheezes. CV: RR nl S1, S2. Abd: soft, benign, no masses. BS+ Ext: No edema. Pulses 2+    Planned procedures: Proceed with colonoscopy. The patient understands the nature of the planned procedure, indications, risks, alternatives and potential complications including but not limited to bleeding, infection, perforation, damage to internal organs and possible oversedation/side effects from anesthesia. The patient agrees and gives consent to proceed.  Please refer to procedure notes for findings, recommendations and patient disposition/instructions.     [name] K. Norma Fredrickson, M.D. Gastroenterology 01/19/2024  9:20 AM

## 2024-01-19 NOTE — Transfer of Care (Signed)
 Immediate Anesthesia Transfer of Care Note  Patient: Shawn Warner  Procedure(s) Performed: COLONOSCOPY WITH PROPOFOL  Patient Location: Endoscopy Unit  Anesthesia Type:General  Level of Consciousness: awake, alert , and oriented  Airway & Oxygen Therapy: Patient Spontanous Breathing  Post-op Assessment: Report given to RN and Post -op Vital signs reviewed and stable  Post vital signs: Reviewed and stable  Last Vitals:  Vitals Value Taken Time  BP    Temp    Pulse    Resp    SpO2      Last Pain:  Vitals:   01/19/24 0846  TempSrc: Temporal  PainSc: 0-No pain         Complications: No notable events documented.

## 2024-02-16 ENCOUNTER — Other Ambulatory Visit: Payer: Self-pay | Admitting: Gastroenterology

## 2024-02-16 DIAGNOSIS — F1010 Alcohol abuse, uncomplicated: Secondary | ICD-10-CM

## 2024-02-16 DIAGNOSIS — B182 Chronic viral hepatitis C: Secondary | ICD-10-CM

## 2024-02-21 ENCOUNTER — Ambulatory Visit

## 2024-02-22 ENCOUNTER — Ambulatory Visit
Admission: RE | Admit: 2024-02-22 | Discharge: 2024-02-22 | Disposition: A | Discharge status: Home / Self Care | Source: Ambulatory Visit | Attending: Gastroenterology | Admitting: Gastroenterology

## 2024-02-22 DIAGNOSIS — F1010 Alcohol abuse, uncomplicated: Secondary | ICD-10-CM | POA: Insufficient documentation

## 2024-02-22 DIAGNOSIS — B182 Chronic viral hepatitis C: Secondary | ICD-10-CM | POA: Insufficient documentation

## 2024-06-26 ENCOUNTER — Encounter (INDEPENDENT_AMBULATORY_CARE_PROVIDER_SITE_OTHER): Admitting: Ophthalmology

## 2024-07-03 ENCOUNTER — Encounter (INDEPENDENT_AMBULATORY_CARE_PROVIDER_SITE_OTHER): Admitting: Ophthalmology

## 2024-07-03 DIAGNOSIS — H348311 Tributary (branch) retinal vein occlusion, right eye, with retinal neovascularization: Secondary | ICD-10-CM | POA: Diagnosis not present

## 2024-07-03 DIAGNOSIS — H35031 Hypertensive retinopathy, right eye: Secondary | ICD-10-CM

## 2024-07-03 DIAGNOSIS — I10 Essential (primary) hypertension: Secondary | ICD-10-CM

## 2024-07-03 DIAGNOSIS — H43811 Vitreous degeneration, right eye: Secondary | ICD-10-CM | POA: Diagnosis not present

## 2024-08-07 ENCOUNTER — Encounter (INDEPENDENT_AMBULATORY_CARE_PROVIDER_SITE_OTHER): Admitting: Ophthalmology

## 2024-08-07 DIAGNOSIS — H35031 Hypertensive retinopathy, right eye: Secondary | ICD-10-CM

## 2024-08-07 DIAGNOSIS — H43811 Vitreous degeneration, right eye: Secondary | ICD-10-CM

## 2024-08-07 DIAGNOSIS — H34811 Central retinal vein occlusion, right eye, with macular edema: Secondary | ICD-10-CM | POA: Diagnosis not present

## 2024-08-07 DIAGNOSIS — I10 Essential (primary) hypertension: Secondary | ICD-10-CM | POA: Diagnosis not present

## 2024-09-04 ENCOUNTER — Encounter (INDEPENDENT_AMBULATORY_CARE_PROVIDER_SITE_OTHER): Admitting: Ophthalmology

## 2024-09-04 DIAGNOSIS — H35031 Hypertensive retinopathy, right eye: Secondary | ICD-10-CM

## 2024-09-04 DIAGNOSIS — H43811 Vitreous degeneration, right eye: Secondary | ICD-10-CM

## 2024-09-04 DIAGNOSIS — H2511 Age-related nuclear cataract, right eye: Secondary | ICD-10-CM

## 2024-09-04 DIAGNOSIS — H34811 Central retinal vein occlusion, right eye, with macular edema: Secondary | ICD-10-CM

## 2024-09-04 DIAGNOSIS — I10 Essential (primary) hypertension: Secondary | ICD-10-CM

## 2024-09-04 DIAGNOSIS — H34831 Tributary (branch) retinal vein occlusion, right eye, with macular edema: Secondary | ICD-10-CM

## 2024-10-02 ENCOUNTER — Encounter (INDEPENDENT_AMBULATORY_CARE_PROVIDER_SITE_OTHER): Admitting: Ophthalmology

## 2024-10-02 DIAGNOSIS — H34831 Tributary (branch) retinal vein occlusion, right eye, with macular edema: Secondary | ICD-10-CM

## 2024-10-02 DIAGNOSIS — H43811 Vitreous degeneration, right eye: Secondary | ICD-10-CM

## 2024-10-02 DIAGNOSIS — H35031 Hypertensive retinopathy, right eye: Secondary | ICD-10-CM | POA: Diagnosis not present

## 2024-10-02 DIAGNOSIS — I10 Essential (primary) hypertension: Secondary | ICD-10-CM

## 2024-10-02 DIAGNOSIS — H34811 Central retinal vein occlusion, right eye, with macular edema: Secondary | ICD-10-CM

## 2024-10-30 ENCOUNTER — Encounter (INDEPENDENT_AMBULATORY_CARE_PROVIDER_SITE_OTHER): Admitting: Ophthalmology

## 2024-11-27 ENCOUNTER — Encounter (INDEPENDENT_AMBULATORY_CARE_PROVIDER_SITE_OTHER): Admitting: Ophthalmology

## 2024-12-18 ENCOUNTER — Encounter (INDEPENDENT_AMBULATORY_CARE_PROVIDER_SITE_OTHER): Admitting: Ophthalmology

## 2024-12-28 ENCOUNTER — Encounter (INDEPENDENT_AMBULATORY_CARE_PROVIDER_SITE_OTHER): Admitting: Ophthalmology
